# Patient Record
Sex: Male | Born: 1970 | Race: White | Marital: Single
Health system: Southern US, Community
[De-identification: ages and names within clinical notes are randomized; demographics above are authoritative.]

## PROBLEM LIST (undated history)

## (undated) HISTORY — PX: OTHER SURGICAL HISTORY: SHX169

---

## 2013-09-18 ENCOUNTER — Emergency Department (HOSPITAL_COMMUNITY): Payer: Medicaid Other

## 2013-09-18 ENCOUNTER — Encounter (HOSPITAL_COMMUNITY): Payer: Self-pay | Admitting: Emergency Medicine

## 2013-09-18 ENCOUNTER — Emergency Department (HOSPITAL_COMMUNITY)
Admission: EM | Admit: 2013-09-18 | Discharge: 2013-09-19 | Disposition: A | Discharge status: Home / Self Care | Payer: Medicaid Other | Attending: Emergency Medicine | Admitting: Emergency Medicine

## 2013-09-18 DIAGNOSIS — T148XXA Other injury of unspecified body region, initial encounter: Secondary | ICD-10-CM

## 2013-09-18 DIAGNOSIS — Z23 Encounter for immunization: Secondary | ICD-10-CM | POA: Insufficient documentation

## 2013-09-18 DIAGNOSIS — F17200 Nicotine dependence, unspecified, uncomplicated: Secondary | ICD-10-CM | POA: Insufficient documentation

## 2013-09-18 DIAGNOSIS — G529 Cranial nerve disorder, unspecified: Secondary | ICD-10-CM | POA: Insufficient documentation

## 2013-09-18 DIAGNOSIS — S0180XA Unspecified open wound of other part of head, initial encounter: Secondary | ICD-10-CM | POA: Insufficient documentation

## 2013-09-18 DIAGNOSIS — S0181XA Laceration without foreign body of other part of head, initial encounter: Secondary | ICD-10-CM

## 2013-09-18 DIAGNOSIS — S1190XA Unspecified open wound of unspecified part of neck, initial encounter: Secondary | ICD-10-CM | POA: Insufficient documentation

## 2013-09-18 DIAGNOSIS — S41109A Unspecified open wound of unspecified upper arm, initial encounter: Secondary | ICD-10-CM | POA: Insufficient documentation

## 2013-09-18 LAB — CBC WITH DIFFERENTIAL/PLATELET
Basophils Absolute: 0 10*3/uL (ref 0.0–0.1)
Basophils Relative: 0 % (ref 0–1)
EOS PCT: 2 % (ref 0–5)
Eosinophils Absolute: 0.2 10*3/uL (ref 0.0–0.7)
HEMATOCRIT: 44.7 % (ref 39.0–52.0)
HEMOGLOBIN: 15.5 g/dL (ref 13.0–17.0)
LYMPHS ABS: 5.4 10*3/uL — AB (ref 0.7–4.0)
LYMPHS PCT: 51 % — AB (ref 12–46)
MCH: 31.8 pg (ref 26.0–34.0)
MCHC: 34.7 g/dL (ref 30.0–36.0)
MCV: 91.8 fL (ref 78.0–100.0)
MONO ABS: 1 10*3/uL (ref 0.1–1.0)
MONOS PCT: 9 % (ref 3–12)
NEUTROS ABS: 4 10*3/uL (ref 1.7–7.7)
Neutrophils Relative %: 38 % — ABNORMAL LOW (ref 43–77)
Platelets: 278 10*3/uL (ref 150–400)
RBC: 4.87 MIL/uL (ref 4.22–5.81)
RDW: 13.6 % (ref 11.5–15.5)
WBC: 10.6 10*3/uL — AB (ref 4.0–10.5)
nRBC: 0 /100 WBC

## 2013-09-18 LAB — I-STAT CHEM 8, ED
BUN: 9 mg/dL (ref 6–23)
CALCIUM ION: 1.06 mmol/L — AB (ref 1.12–1.23)
CHLORIDE: 102 meq/L (ref 96–112)
CREATININE: 1.4 mg/dL — AB (ref 0.50–1.35)
GLUCOSE: 103 mg/dL — AB (ref 70–99)
HEMATOCRIT: 49 % (ref 39.0–52.0)
HEMOGLOBIN: 16.7 g/dL (ref 13.0–17.0)
POTASSIUM: 3.4 meq/L — AB (ref 3.7–5.3)
Sodium: 144 mEq/L (ref 137–147)
TCO2: 25 mmol/L (ref 0–100)

## 2013-09-18 LAB — COMPREHENSIVE METABOLIC PANEL
ALK PHOS: 132 U/L — AB (ref 39–117)
ALT: 27 U/L (ref 0–53)
AST: 56 U/L — ABNORMAL HIGH (ref 0–37)
Albumin: 4.2 g/dL (ref 3.5–5.2)
BUN: 9 mg/dL (ref 6–23)
CHLORIDE: 101 meq/L (ref 96–112)
CO2: 23 meq/L (ref 19–32)
CREATININE: 1.06 mg/dL (ref 0.50–1.35)
Calcium: 9.2 mg/dL (ref 8.4–10.5)
GFR calc Af Amer: >90 mL/min (ref >90)
GFR, EST NON AFRICAN AMERICAN: 86 mL/min — AB (ref >90)
Glucose, Bld: 104 mg/dL — ABNORMAL HIGH (ref 70–99)
POTASSIUM: 3.6 meq/L — AB (ref 3.7–5.3)
Sodium: 144 mEq/L (ref 137–147)
Total Bilirubin: 0.4 mg/dL (ref 0.3–1.2)
Total Protein: 7.6 g/dL (ref 6.0–8.3)

## 2013-09-18 LAB — ABO/RH: ABO/RH(D): A POS

## 2013-09-18 LAB — PROTIME-INR
INR: 0.97 (ref 0.00–1.49)
PROTHROMBIN TIME: 12.7 s (ref 11.6–15.2)

## 2013-09-18 LAB — ETHANOL: Alcohol, Ethyl (B): 141 mg/dL — ABNORMAL HIGH (ref 0–11)

## 2013-09-18 MED ORDER — TETANUS-DIPHTH-ACELL PERTUSSIS 5-2.5-18.5 LF-MCG/0.5 IM SUSP
0.5000 mL | Freq: Once | INTRAMUSCULAR | Status: AC
  Administered 2013-09-18: 0.5 mL via INTRAMUSCULAR
  Filled 2013-09-18: qty 0.5

## 2013-09-18 MED ORDER — IOHEXOL 350 MG/ML SOLN
100.0000 mL | Freq: Once | INTRAVENOUS | Status: AC | PRN
  Administered 2013-09-18: 100 mL via INTRAVENOUS

## 2013-09-18 MED ORDER — LIDOCAINE-EPINEPHRINE 1 %-1:100000 IJ SOLN
10.0000 mL | Freq: Once | INTRAMUSCULAR | Status: AC
  Administered 2013-09-18: 10 mL via INTRADERMAL
  Filled 2013-09-18: qty 1
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
## 2013-09-18 NOTE — ED Notes (Addendum)
Patient transported to CT scan with RN.

## 2013-09-19 LAB — PREPARE FRESH FROZEN PLASMA
Unit division: 0
Unit division: 0

## 2013-09-19 LAB — TYPE AND SCREEN
ABO/RH(D): A POS
Antibody Screen: NEGATIVE
Unit division: 0
Unit division: 0
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
## 2013-09-19 NOTE — Discharge Instructions (Signed)
Facial Laceration ° A facial laceration is a cut on the face. These injuries can be painful and cause bleeding. Lacerations usually heal quickly, but they need special care to reduce scarring. °DIAGNOSIS  °Your health care provider will take a medical history, ask for details about how the injury occurred, and examine the wound to determine how deep the cut is. °TREATMENT  °Some facial lacerations may not require closure. Others may not be able to be closed because of an increased risk of infection. The risk of infection and the chance for successful closure will depend on various factors, including the amount of time since the injury occurred. °The wound may be cleaned to help prevent infection. If closure is appropriate, pain medicines may be given if needed. Your health care provider will use stitches (sutures), wound glue (adhesive), or skin adhesive strips to repair the laceration. These tools bring the skin edges together to allow for faster healing and a better cosmetic outcome. If needed, you may also be given a tetanus shot. °HOME CARE INSTRUCTIONS °· Only take over-the-counter or prescription medicines as directed by your health care provider. °· Follow your health care provider's instructions for wound care. These instructions will vary depending on the technique used for closing the wound. °For Sutures: °· Keep the wound clean and dry.   °· If you were given a bandage (dressing), you should change it at least once a day. Also change the dressing if it becomes wet or dirty, or as directed by your health care provider.   °· Wash the wound with soap and water 2 times a day. Rinse the wound off with water to remove all soap. Pat the wound dry with a clean towel.   °· After cleaning, apply a thin layer of the antibiotic ointment recommended by your health care provider. This will help prevent infection and keep the dressing from sticking.   °· You may shower as usual after the first 24 hours. Do not soak the  wound in water until the sutures are removed.   °· Get your sutures removed as directed by your health care provider. With facial lacerations, sutures should usually be taken out after 4 5 days to avoid stitch marks.   °· Wait a few days after your sutures are removed before applying any makeup. °For Skin Adhesive Strips: °· Keep the wound clean and dry.   °· Do not get the skin adhesive strips wet. You may bathe carefully, using caution to keep the wound dry.   °· If the wound gets wet, pat it dry with a clean towel.   °· Skin adhesive strips will fall off on their own. You may trim the strips as the wound heals. Do not remove skin adhesive strips that are still stuck to the wound. They will fall off in time.   °For Wound Adhesive: °· You may briefly wet your wound in the shower or bath. Do not soak or scrub the wound. Do not swim. Avoid periods of heavy sweating until the skin adhesive has fallen off on its own. After showering or bathing, gently pat the wound dry with a clean towel.   °· Do not apply liquid medicine, cream medicine, ointment medicine, or makeup to your wound while the skin adhesive is in place. This may loosen the film before your wound is healed.   °· If a dressing is placed over the wound, be careful not to apply tape directly over the skin adhesive. This may cause the adhesive to be pulled off before the wound is healed.   °·   Avoid prolonged exposure to sunlight or tanning lamps while the skin adhesive is in place. °· The skin adhesive will usually remain in place for 5 10 days, then naturally fall off the skin. Do not pick at the adhesive film.   °After Healing: °Once the wound has healed, cover the wound with sunscreen during the day for 1 full year. This can help minimize scarring. Exposure to ultraviolet light in the first year will darken the scar. It can take 1 2 years for the scar to lose its redness and to heal completely.  °SEEK IMMEDIATE MEDICAL CARE IF: °· You have redness, pain, or  swelling around the wound.   °· You see a yellowish-white fluid (pus) coming from the wound.   °· You have chills or a fever.   °MAKE SURE YOU: °· Understand these instructions. °· Will watch your condition. °· Will get help right away if you are not doing well or get worse. °Document Released: 08/11/2004 Document Revised: 04/24/2013 Document Reviewed: 02/14/2013 °[employer]® Patient Information ©2014 [employer]. ° °Laceration Care, Adult °A laceration is a cut or lesion that goes through all layers of the skin and into the tissue just beneath the skin. °TREATMENT  °Some lacerations may not require closure. Some lacerations may not be able to be closed due to an increased risk of infection. It is important to see your caregiver as soon as possible after an injury to minimize the risk of infection and maximize the opportunity for successful closure. °If closure is appropriate, pain medicines may be given, if needed. The wound will be cleaned to help prevent infection. Your caregiver will use stitches (sutures), staples, wound glue (adhesive), or skin adhesive strips to repair the laceration. These tools bring the skin edges together to allow for faster healing and a better cosmetic outcome. However, all wounds will heal with a scar. Once the wound has healed, scarring can be minimized by covering the wound with sunscreen during the day for 1 full year. °HOME CARE INSTRUCTIONS  °For sutures or staples: °· Keep the wound clean and dry. °· If you were given a bandage (dressing), you should change it at least once a day. Also, change the dressing if it becomes wet or dirty, or as directed by your caregiver. °· Wash the wound with soap and water 2 times a day. Rinse the wound off with water to remove all soap. Pat the wound dry with a clean towel. °· After cleaning, apply a thin layer of the antibiotic ointment as recommended by your caregiver. This will help prevent infection and keep the dressing from sticking. °· You  may shower as usual after the first 24 hours. Do not soak the wound in water until the sutures are removed. °· Only take over-the-counter or prescription medicines for pain, discomfort, or fever as directed by your caregiver. °· Get your sutures or staples removed as directed by your caregiver. °For skin adhesive strips: °· Keep the wound clean and dry. °· Do not get the skin adhesive strips wet. You may bathe carefully, using caution to keep the wound dry. °· If the wound gets wet, pat it dry with a clean towel. °· Skin adhesive strips will fall off on their own. You may trim the strips as the wound heals. Do not remove skin adhesive strips that are still stuck to the wound. They will fall off in time. °For wound adhesive: °· You may briefly wet your wound in the shower or bath. Do not soak or scrub the wound. Do   not swim. Avoid periods of heavy perspiration until the skin adhesive has fallen off on its own. After showering or bathing, gently pat the wound dry with a clean towel. °· Do not apply liquid medicine, cream medicine, or ointment medicine to your wound while the skin adhesive is in place. This may loosen the film before your wound is healed. °· If a dressing is placed over the wound, be careful not to apply tape directly over the skin adhesive. This may cause the adhesive to be pulled off before the wound is healed. °· Avoid prolonged exposure to sunlight or tanning lamps while the skin adhesive is in place. Exposure to ultraviolet light in the first year will darken the scar. °· The skin adhesive will usually remain in place for 5 to 10 days, then naturally fall off the skin. Do not pick at the adhesive film. °You may need a tetanus shot if: °· You cannot remember when you had your last tetanus shot. °· You have never had a tetanus shot. °If you get a tetanus shot, your arm may swell, get red, and feel warm to the touch. This is common and not a problem. If you need a tetanus shot and you choose not to  have one, there is a rare chance of getting tetanus. Sickness from tetanus can be serious. °SEEK MEDICAL CARE IF:  °· You have redness, swelling, or increasing pain in the wound. °· You see a red line that goes away from the wound. °· You have yellowish-white fluid (pus) coming from the wound. °· You have a fever. °· You notice a bad smell coming from the wound or dressing. °· Your wound breaks open before or after sutures have been removed. °· You notice something coming out of the wound such as wood or glass. °· Your wound is on your hand or foot and you cannot move a finger or toe. °SEEK IMMEDIATE MEDICAL CARE IF:  °· Your pain is not controlled with prescribed medicine. °· You have severe swelling around the wound causing pain and numbness or a change in color in your arm, hand, leg, or foot. °· Your wound splits open and starts bleeding. °· You have worsening numbness, weakness, or loss of function of any joint around or beyond the wound. °· You develop painful lumps near the wound or on the skin anywhere on your body. °MAKE SURE YOU:  °· Understand these instructions. °· Will watch your condition. °· Will get help right away if you are not doing well or get worse. °Document Released: 07/04/2005 Document Revised: 09/26/2011 Document Reviewed: 12/28/2010 °[employer]® Patient Information ©2014 [employer]. ° °
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
## 2017-03-27 ENCOUNTER — Telehealth: Payer: Self-pay | Admitting: General Practice

## 2017-03-27 NOTE — Telephone Encounter (Signed)
Entered in error
